# Patient Record
Sex: Female | Born: 2000 | Race: Black or African American | Hispanic: No | Marital: Single | Smoking: Never smoker
Health system: Southern US, Community
[De-identification: ages and names within clinical notes are randomized; demographics above are authoritative.]

## PROBLEM LIST (undated history)

## (undated) ENCOUNTER — Ambulatory Visit (HOSPITAL_COMMUNITY): Payer: MEDICAID | Source: Home / Self Care

## (undated) DIAGNOSIS — T7840XA Allergy, unspecified, initial encounter: Secondary | ICD-10-CM

## (undated) DIAGNOSIS — F419 Anxiety disorder, unspecified: Secondary | ICD-10-CM

## (undated) HISTORY — PX: APPENDECTOMY: SHX54

---
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
## 2022-02-25 ENCOUNTER — Ambulatory Visit (HOSPITAL_COMMUNITY)
Admission: EM | Admit: 2022-02-25 | Discharge: 2022-02-25 | Disposition: A | Discharge status: Home / Self Care | Payer: Medicaid Other | Attending: Family Medicine | Admitting: Family Medicine

## 2022-02-25 ENCOUNTER — Encounter (HOSPITAL_COMMUNITY): Payer: Self-pay | Admitting: Emergency Medicine

## 2022-02-25 DIAGNOSIS — R059 Cough, unspecified: Secondary | ICD-10-CM

## 2022-02-25 MED ORDER — BENZONATATE 100 MG PO CAPS: 100.0000 mg | ORAL_CAPSULE | Freq: Three times a day (TID) | ORAL | 0 refills | Status: DC

## 2022-02-25 NOTE — ED Provider Notes (Signed)
[facility]    CSN: [id number] Arrival date & time: 02/25/22  1635      History   Chief Complaint Chief Complaint  Patient presents with   Headache   Cough   Generalized Body Aches   Emesis    HPI Virginia Moses is a 21 y.o. female.   HPI Patient presents today for evaluation of a 3 day history of cough,  headache, body aches, nausea, and nasal congestion.  She reports all symptoms have resolved with exception of a mild cough.  Needs a return to work note.  She denies any worsening symptoms of shortness of breath, wheezing or chest pain.  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 Authorizing Provider  benzonatate (TESSALON) 100 MG capsule Take 1 capsule (100 mg total) by mouth every 8 (eight) hours. 02/25/22  Yes Bing Neighbors, FNP  cetirizine (ZYRTEC ALLERGY) 10 MG tablet Take 1 tablet (10 mg total) by mouth at bedtime. 12/16/21 03/16/22  Theadora Rama Scales, PA-C  fluticasone (FLONASE) 50 MCG/ACT nasal spray Place 2 sprays into both nostrils daily. 12/16/21   Theadora Rama Scales, PA-C  ibuprofen (ADVIL) 400 MG tablet Take 1 tablet (400 mg total) by mouth every 8 (eight) hours as needed for up to 30 doses. 12/16/21   Theadora Rama Scales, PA-C  ipratropium (ATROVENT) 0.06 % nasal spray Place 2  sprays into both nostrils 4 (four) times daily. As needed for nasal congestion, runny nose 12/16/21   Theadora Rama Scales, PA-C  Chlorpheniramine Maleate (ALLERGY PO) Take 1 tablet by mouth daily as needed (allergies).  06/15/19  [provider]    Family History Family History  Problem Relation Age of Onset   Dementia Mother    Heart disease Father    Hypertension Paternal Grandmother     Social History Social History   Tobacco Use   Smoking status: Never    Passive exposure: Yes   Smokeless tobacco: Never  Vaping Use   Vaping Use: Never used  Substance Use Topics   Alcohol use: No   Drug use: Not Currently    Types: Marijuana     Allergies   Patient has no known allergies.   Review of Systems Review of Systems Pertinent negatives listed in HPI  Physical Exam Triage Vital Signs ED Triage Vitals [02/25/22 1649]  Enc Vitals Group     BP 125/83     Pulse Rate 74     Resp 18  Temp 98.5 F (36.9 C)     Temp src      SpO2 98 %     Weight      Height      Head Circumference      Peak Flow      Pain Score 0     Pain Loc      Pain Edu?      Excl. in GC?    No data found.  Updated Vital Signs BP 125/83   Pulse 74   Temp 98.5 F (36.9 C)   Resp 18   SpO2 98%   Visual Acuity Right Eye Distance:   Left Eye Distance:   Bilateral Distance:    Right Eye Near:   Left Eye Near:    Bilateral Near:     Physical Exam HENT:     Head: Normocephalic and atraumatic.  Eyes:     Extraocular Movements: Extraocular movements intact.     Pupils: Pupils are equal, round, and reactive to light.  Cardiovascular:     Rate and Rhythm: Normal rate and regular rhythm.  Pulmonary:     Effort: Pulmonary effort is normal.     Breath sounds: Normal breath sounds.  Musculoskeletal:     Cervical back: Normal range of motion.  Skin:    General: Skin is warm.  Neurological:     Mental Status: She is alert.     GCS: GCS eye subscore is 4. GCS verbal subscore  is 5. GCS motor subscore is 6.  Psychiatric:        Mood and Affect: Mood normal.     UC Treatments / Results  Labs (all labs ordered are listed, but only abnormal results are displayed) Labs Reviewed - No data to display  EKG   Radiology No results found.  Procedures Procedures (including critical care time)  Medications Ordered in UC Medications - No data to display  Initial Impression / Assessment and Plan / UC Course  I have reviewed the triage vital signs and the nursing notes.  Pertinent labs & imaging results that were available during my care of the patient were reviewed by me and considered in my medical decision making (see chart for details).    Cough, s/p recent viral illness Work note provided. Benzonatate TID PRN Return as needed Final Clinical Impressions(s) / UC Diagnoses   Final diagnoses:  Cough, unspecified type   Discharge Instructions   None    ED Prescriptions     Medication Sig Dispense Auth. Provider   benzonatate (TESSALON) 100 MG capsule Take 1 capsule (100 mg total) by mouth every 8 (eight) hours. 21 capsule Bing Neighbors, FNP      PDMP not reviewed this encounter.   Bing Neighbors, FNP 02/25/22 1725

## 2022-02-25 NOTE — ED Triage Notes (Signed)
Pt is present today with c/o vomiting, cough, HA, and body aches. Pt sx started Friday

## 2022-08-27 ENCOUNTER — Ambulatory Visit (HOSPITAL_COMMUNITY)
Admission: EM | Admit: 2022-08-27 | Discharge: 2022-08-27 | Disposition: A | Discharge status: Home / Self Care | Payer: Medicaid Other

## 2022-08-27 ENCOUNTER — Encounter (HOSPITAL_COMMUNITY): Payer: Self-pay

## 2022-08-27 DIAGNOSIS — R112 Nausea with vomiting, unspecified: Secondary | ICD-10-CM | POA: Diagnosis not present

## 2022-08-27 NOTE — ED Triage Notes (Signed)
Pt needs a work note Saturday - Monday

## 2022-08-27 NOTE — ED Provider Notes (Signed)
[facility]    CSN: [id number] Arrival date & time: 08/27/22  1711      History   Chief Complaint No chief complaint on file.   HPI Virginia Moses is a 21 y.o. female.   Pt reports about three days ago she started experiencing nausea, vomiting, and fatigue.  She missed work due to [employer].  She denies fever, chill, diarrhea, cough, congestion. Pt would like to return to work tonight and is requesting a work note to return.     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 Authorizing Provider  benzonatate (TESSALON) 100 MG capsule Take 1 capsule (100 mg total) by mouth every 8 (eight) hours. 02/25/22   Bing Neighbors, FNP  cetirizine (ZYRTEC ALLERGY) 10 MG tablet Take 1 tablet (10 mg total) by mouth at bedtime. 12/16/21 03/16/22  Theadora Rama Scales, PA-C  fluticasone (FLONASE) 50 MCG/ACT nasal spray Place 2 sprays into both nostrils daily. 12/16/21   Theadora Rama Scales, PA-C  ibuprofen (ADVIL) 400 MG tablet Take 1 tablet (400 mg total) by mouth every 8 (eight) hours as needed for up to 30 doses. 12/16/21   Theadora Rama Scales, PA-C  ipratropium (ATROVENT) 0.06 % nasal spray Place 2 sprays into both nostrils 4 (four) times daily. As needed for nasal congestion, runny nose 12/16/21   Theadora Rama Scales,  PA-C  Chlorpheniramine Maleate (ALLERGY PO) Take 1 tablet by mouth daily as needed (allergies).  06/15/19  [provider]    Family History Family History  Problem Relation Age of Onset   Dementia Mother    Heart disease Father    Hypertension Paternal Grandmother     Social History Social History   Tobacco Use   Smoking status: Never    Passive exposure: Yes   Smokeless tobacco: Never  Vaping Use   Vaping Use: Never used  Substance Use Topics   Alcohol use: No   Drug use: Not Currently    Types: Marijuana     Allergies   Patient has no known allergies.   Review of Systems Review of Systems  Constitutional:  Negative for chills and fever.  HENT:  Negative for ear pain and sore throat.   Eyes:  Negative for pain and visual disturbance.  Respiratory:  Negative for cough and shortness of breath.   Cardiovascular:  Negative for chest pain and palpitations.  Gastrointestinal:  Negative for abdominal pain and vomiting.  Genitourinary:  Negative for dysuria and hematuria.  Musculoskeletal:  Negative for arthralgias and back pain.  Skin:  Negative for color change and rash.  Neurological:  Negative for seizures and syncope.  All other systems reviewed and are negative.    Physical Exam Triage Vital Signs ED Triage Vitals [08/27/22 1811]  Enc Vitals Group     BP 108/69     Pulse Rate 71     Resp 20     Temp 98.2 F (36.8 C)     Temp Source Oral     SpO2 99 %     Weight      Height      Head Circumference      Peak Flow      Pain Score      Pain Loc      Pain Edu?      Excl. in GC?    No data found.  Updated Vital Signs BP 108/69 (BP Location: Left Arm)   Pulse 71   Temp 98.2 F (36.8 C) (Oral)   Resp 20   LMP 08/13/2022   SpO2 99%   Visual Acuity Right Eye Distance:   Left Eye Distance:   Bilateral Distance:    Right Eye Near:   Left Eye Near:    Bilateral Near:     Physical Exam Vitals and nursing note reviewed.   Constitutional:      General: She is not in acute distress.    Appearance: She is well-developed.  HENT:     Head: Normocephalic and atraumatic.  Eyes:     Conjunctiva/sclera: Conjunctivae normal.  Cardiovascular:     Rate and Rhythm: Normal rate and regular rhythm.     Heart sounds: No murmur heard. Pulmonary:     Effort: Pulmonary effort is normal. No respiratory distress.     Breath sounds: Normal breath sounds.  Abdominal:     Palpations: Abdomen is soft.     Tenderness: There is no abdominal tenderness.  Musculoskeletal:        General: No swelling.     Cervical back: Neck supple.  Skin:    General: Skin is warm and dry.     Capillary Refill: Capillary refill takes less than 2 seconds.  Neurological:     Mental Status: She is alert.  Psychiatric:        Mood and Affect: Mood normal.      UC Treatments / Results  Labs (all labs ordered are listed, but only abnormal results are displayed) Labs Reviewed - No data to display  EKG   Radiology No results found.  Procedures Procedures (including critical care time)  Medications Ordered in UC Medications - No data to display  Initial Impression / Assessment and Plan / UC Course  I have reviewed the triage vital signs and the nursing notes.  Pertinent labs & imaging results that were available during my care of the patient were reviewed by me and considered in my medical decision making (see chart for details).     Nausea and vomiting.  Sx have resolved at this time.  Work note given for patient to return tonight.  Return precautions given.  Final Clinical Impressions(s) / UC Diagnoses   Final diagnoses:  Nausea and vomiting, unspecified vomiting type   Discharge Instructions   None    ED Prescriptions   None    PDMP not reviewed this encounter.   Ward, Tylene Fantasia, PA-C 08/27/22 2020

## 2022-12-17 ENCOUNTER — Ambulatory Visit (HOSPITAL_COMMUNITY)
Admission: EM | Admit: 2022-12-17 | Discharge: 2022-12-17 | Disposition: A | Discharge status: Home / Self Care | Payer: Medicaid Other | Attending: Family Medicine | Admitting: Family Medicine

## 2022-12-17 ENCOUNTER — Encounter (HOSPITAL_COMMUNITY): Payer: Self-pay

## 2022-12-17 DIAGNOSIS — K529 Noninfective gastroenteritis and colitis, unspecified: Secondary | ICD-10-CM

## 2022-12-17 MED ORDER — ONDANSETRON 4 MG PO TBDP: 4.0000 mg | ORAL_TABLET | Freq: Three times a day (TID) | ORAL | 0 refills | Status: DC | PRN

## 2022-12-17 NOTE — ED Triage Notes (Signed)
Pt states N/V/D since last night.

## 2022-12-17 NOTE — ED Provider Notes (Signed)
[facility]    CSN: [id number] Arrival date & time: 12/17/22  1305      History   Chief Complaint Chief Complaint  Patient presents with   Emesis    HPI Virginia Moses is a 22 y.o. female.    Emesis  Here for nausea and vomiting and diarrhea.  Symptoms began about 130 this morning.  She has thrown up about 5 or 6 times.  She has had diarrhea stool about twice.  She cannot tell if she has had any blood in anything.  No cough or congestion. Last menstrual cycle was March 1.   Past Medical History:  Diagnosis Date   Allergy    Anxiety     Patient Active Problem List   Diagnosis Date Noted   MDD (major depressive disorder), recurrent severe, without psychosis ([place]) 08/28/2020   Suicide attempt by drug ingestion ([place]) 08/27/2020   Intentional ibuprofen overdose ([place])    Suicidal behavior with attempted self-injury [facility])     Past Surgical History:  Procedure Laterality Date   APPENDECTOMY      OB History     Gravida  0   Para      Term      Preterm      AB      Living         SAB      IAB      Ectopic      Multiple      Live Births               Home Medications    Prior to Admission medications   Medication Sig Start Date End Date Taking? Authorizing Provider  ondansetron (ZOFRAN-ODT) 4 MG disintegrating tablet Take 1 tablet (4 mg total) by mouth every 8 (eight) hours as needed for nausea or vomiting. 12/17/22  Yes Barrett Henle, MD  cetirizine (ZYRTEC ALLERGY) 10 MG tablet Take 1 tablet (10 mg total) by mouth at bedtime. 12/16/21 03/16/22  Lynden Oxford Scales, PA-C  Chlorpheniramine Maleate (ALLERGY PO) Take 1 tablet by mouth daily as needed (allergies).  06/15/19  [provider]    Family History Family History  Problem Relation Age of Onset   Dementia Mother    Heart disease Father    Hypertension Paternal Grandmother     Social History Social History   Tobacco Use   Smoking status: Never     Passive exposure: Yes   Smokeless tobacco: Never  Vaping Use   Vaping Use: Never used  Substance Use Topics   Alcohol use: No   Drug use: Not Currently    Types: Marijuana     Allergies   Patient has no known allergies.   Review of Systems Review of Systems  Gastrointestinal:  Positive for vomiting.     Physical Exam Triage Vital Signs ED Triage Vitals  Enc Vitals Group     BP 12/17/22 1331 115/82     Pulse Rate 12/17/22 1331 (!) 101     Resp 12/17/22 1331 16     Temp 12/17/22 1331 98.8 F (37.1 C)     Temp Source 12/17/22 1331 Oral     SpO2 12/17/22 1331 98 %     Weight --      Height --      Head Circumference --      Peak Flow --      Pain Score 12/17/22 1333 0     Pain Loc --  Pain Edu? --      Excl. in [place]? --    No data found.  Updated Vital Signs BP 115/82 (BP Location: Right Arm)   Pulse (!) 101   Temp 98.8 F (37.1 C) (Oral)   Resp 16   LMP 11/29/2022 (Approximate)   SpO2 98%   Visual Acuity Right Eye Distance:   Left Eye Distance:   Bilateral Distance:    Right Eye Near:   Left Eye Near:    Bilateral Near:     Physical Exam Vitals reviewed.  Constitutional:      General: She is not in acute distress.    Appearance: She is not ill-appearing, toxic-appearing or diaphoretic.  HENT:     Nose: Nose normal.     Mouth/Throat:     Mouth: Mucous membranes are moist.     Pharynx: No oropharyngeal exudate or posterior oropharyngeal erythema.  Eyes:     Extraocular Movements: Extraocular movements intact.     Conjunctiva/sclera: Conjunctivae normal.     Pupils: Pupils are equal, round, and reactive to light.  Cardiovascular:     Rate and Rhythm: Normal rate and regular rhythm.     Heart sounds: No murmur heard. Pulmonary:     Effort: No respiratory distress.     Breath sounds: No stridor. No wheezing, rhonchi or rales.  Abdominal:     General: There is no distension.     Palpations: Abdomen is soft.     Tenderness: There is  abdominal tenderness (generalized).  Musculoskeletal:     Cervical back: Neck supple.  Lymphadenopathy:     Cervical: No cervical adenopathy.  Skin:    Capillary Refill: Capillary refill takes less than 2 seconds.     Coloration: Skin is not jaundiced or pale.  Neurological:     General: No focal deficit present.     Mental Status: She is alert and oriented to person, place, and time.  Psychiatric:        Behavior: Behavior normal.      UC Treatments / Results  Labs (all labs ordered are listed, but only abnormal results are displayed) Labs Reviewed - No data to display  EKG   Radiology No results found.  Procedures Procedures (including critical care time)  Medications Ordered in UC Medications - No data to display  Initial Impression / Assessment and Plan / UC Course  I have reviewed the triage vital signs and the nursing notes.  Pertinent labs & imaging results that were available during my care of the patient were reviewed by me and considered in my medical decision making (see chart for details).         Final Clinical Impressions(s) / UC Diagnoses   Final diagnoses:  Gastroenteritis     Discharge Instructions      Ondansetron dissolved in the mouth every 8 hours as needed for nausea or vomiting. Clear liquids and bland things to eat. Avoid acidic foods like lemon/lime/orange/tomato.       ED Prescriptions     Medication Sig Dispense Auth. Provider   ondansetron (ZOFRAN-ODT) 4 MG disintegrating tablet Take 1 tablet (4 mg total) by mouth every 8 (eight) hours as needed for nausea or vomiting. 10 tablet Windy Carina Gwenlyn Perking, MD      PDMP not reviewed this encounter.   Barrett Henle, MD 12/17/22 1400

## 2022-12-17 NOTE — Discharge Instructions (Signed)
Ondansetron dissolved in the mouth every 8 hours as needed for nausea or vomiting. Clear liquids and bland things to eat. Avoid acidic foods like lemon/lime/orange/tomato.  

## 2023-01-08 ENCOUNTER — Ambulatory Visit
Admission: EM | Admit: 2023-01-08 | Discharge: 2023-01-08 | Disposition: A | Discharge status: Home / Self Care | Payer: Medicaid Other | Attending: Internal Medicine | Admitting: Internal Medicine

## 2023-01-08 DIAGNOSIS — Z113 Encounter for screening for infections with a predominantly sexual mode of transmission: Secondary | ICD-10-CM | POA: Diagnosis not present

## 2023-01-08 DIAGNOSIS — J069 Acute upper respiratory infection, unspecified: Secondary | ICD-10-CM

## 2023-01-08 DIAGNOSIS — N898 Other specified noninflammatory disorders of vagina: Secondary | ICD-10-CM

## 2023-01-08 DIAGNOSIS — Z3202 Encounter for pregnancy test, result negative: Secondary | ICD-10-CM | POA: Diagnosis not present

## 2023-01-08 DIAGNOSIS — N926 Irregular menstruation, unspecified: Secondary | ICD-10-CM | POA: Diagnosis not present

## 2023-01-08 LAB — POCT URINE PREGNANCY: Preg Test, Ur: NEGATIVE

## 2023-01-08 NOTE — Discharge Instructions (Signed)
STD testing is pending.  We will call if it is abnormal.  Urine pregnancy test was negative.  Blood work for pregnancy is pending.  Please follow-up if any symptoms persist or worsen.  Refrain from sexual activity until test results and treatment are complete if treatment is necessary.  If you continue to miss your menstrual cycle, please follow-up with gynecology.

## 2023-01-08 NOTE — ED Triage Notes (Signed)
Pt here for work note to return tomorrow. States has been sick since last Friday. Now feels everything has resolved except some nasal drainage. Ready to return to work.

## 2023-01-08 NOTE — ED Provider Notes (Signed)
[facility]    CSN: [id number] Arrival date & time: 01/08/23  1229      History   Chief Complaint Chief Complaint  Patient presents with   sti screening    HPI Virginia Moses is a 22 y.o. female.   Patient presents with 2 different complaints today.  Patient reports that she needs a note to return to work after having upper respiratory infection.  She reports that she had nasal congestion, nasal drainage, sore throat, cough that started 6 days ago.  She reports all symptoms have resolved except for runny nose.  She denies any fevers.  Has taken over the counter cold and flu medications with resolution of symptoms.  Denies history of asthma.  Patient also presenting for STD testing and pregnancy test.  Patient reports that she has had a vaginal odor for about a week but no vaginal discharge or associated symptoms.  She has had unprotected intercourse with a female sexual partner but denies exposure to STD.  Last menstrual cycle was 11/29/2022.  She does not use a form of birth control.      Past Medical History:  Diagnosis Date   Allergy    Anxiety     Patient Active Problem List   Diagnosis Date Noted   MDD (major depressive disorder), recurrent severe, without psychosis 08/28/2020   Suicide attempt by drug ingestion 08/27/2020   Intentional ibuprofen overdose    Suicidal behavior with attempted self-injury [facility](HCC)     Past Surgical History:  Procedure Laterality Date   APPENDECTOMY      OB History     Gravida  0   Para      Term      Preterm      AB      Living         SAB      IAB      Ectopic      Multiple      Live Births               Home Medications    Prior to Admission medications   Medication Sig Start Date End Date Taking? Authorizing Provider  cetirizine (ZYRTEC ALLERGY) 10 MG tablet Take 1 tablet (10 mg total) by mouth at bedtime. 12/16/21 03/16/22  Theadora RamaMorgan, Lindsay Scales, PA-C  ondansetron (ZOFRAN-ODT) 4 MG  disintegrating tablet Take 1 tablet (4 mg total) by mouth every 8 (eight) hours as needed for nausea or vomiting. 12/17/22   Zenia ResidesBanister, Pamela K, MD  Chlorpheniramine Maleate (ALLERGY PO) Take 1 tablet by mouth daily as needed (allergies).  06/15/19  [provider]    Family History Family History  Problem Relation Age of Onset   Dementia Mother    Heart disease Father    Hypertension Paternal Grandmother     Social History Social History   Tobacco Use   Smoking status: Never    Passive exposure: Yes   Smokeless tobacco: Never  Vaping Use   Vaping Use: Never used  Substance Use Topics   Alcohol use: No   Drug use: Not Currently    Types: Marijuana     Allergies   Patient has no known allergies.   Review of Systems Review of Systems Per HPI  Physical Exam Triage Vital Signs ED Triage Vitals [01/08/23 1429]  Enc Vitals Group     BP 118/74     Pulse Rate 61     Resp 16     Temp 98.9  F (37.2 C)     Temp Source Oral     SpO2 99 %     Weight      Height      Head Circumference      Peak Flow      Pain Score 0     Pain Loc      Pain Edu?      Excl. in [place]?    No data found.  Updated Vital Signs BP 118/74 (BP Location: Left Arm)   Pulse 61   Temp 98.9 F (37.2 C) (Oral)   Resp 16   LMP 11/29/2022 (Approximate)   SpO2 99%   Visual Acuity Right Eye Distance:   Left Eye Distance:   Bilateral Distance:    Right Eye Near:   Left Eye Near:    Bilateral Near:     Physical Exam Constitutional:      General: She is not in acute distress.    Appearance: Normal appearance. She is not toxic-appearing or diaphoretic.  HENT:     Head: Normocephalic and atraumatic.     Right Ear: Tympanic membrane and ear canal normal.     Left Ear: Tympanic membrane and ear canal normal.     Nose: Rhinorrhea present. No congestion. Rhinorrhea is clear.     Mouth/Throat:     Mouth: Mucous membranes are moist.     Pharynx: No posterior oropharyngeal erythema.   Eyes:     Extraocular Movements: Extraocular movements intact.     Conjunctiva/sclera: Conjunctivae normal.     Pupils: Pupils are equal, round, and reactive to light.  Cardiovascular:     Rate and Rhythm: Normal rate and regular rhythm.     Pulses: Normal pulses.     Heart sounds: Normal heart sounds.  Pulmonary:     Effort: Pulmonary effort is normal. No respiratory distress.     Breath sounds: Normal breath sounds. No wheezing.  Abdominal:     General: Abdomen is flat. Bowel sounds are normal.     Palpations: Abdomen is soft.  Genitourinary:    Comments: Deferred with shared decision making.  Self swab performed. Musculoskeletal:        General: Normal range of motion.     Cervical back: Normal range of motion.  Skin:    General: Skin is warm and dry.  Neurological:     General: No focal deficit present.     Mental Status: She is alert and oriented to person, place, and time. Mental status is at baseline.  Psychiatric:        Mood and Affect: Mood normal.        Behavior: Behavior normal.      UC Treatments / Results  Labs (all labs ordered are listed, but only abnormal results are displayed) Labs Reviewed  RPR  HIV ANTIBODY (ROUTINE TESTING W REFLEX)  HCG, QUANTITATIVE, PREGNANCY  POCT URINE PREGNANCY  CERVICOVAGINAL ANCILLARY ONLY    EKG   Radiology No results found.  Procedures Procedures (including critical care time)  Medications Ordered in UC Medications - No data to display  Initial Impression / Assessment and Plan / UC Course  I have reviewed the triage vital signs and the nursing notes.  Pertinent labs & imaging results that were available during my care of the patient were reviewed by me and considered in my medical decision making (see chart for details).     1.  Viral upper respiratory infection  Patient reports symptoms have mainly resolved  except runny nose which is notable on exam.  She is here for work note.  Advised patient that she  may still be contagious but it is reassuring that she has been fever free without medication.  Therefore, work note was provided for patient.  Advised supportive care and symptom management.  Advised strict return precautions if symptoms persist or worsen.  Patient verbalized understanding and was agreeable with plan.  2.  Vaginal odor  Vaginal swab is pending.  Awaiting result.  Given no confirmed exposure to STD, will await result for treatment.  HIV and RPR pending.  Urine pregnancy test was negative.  Will obtain quantitative hCG given patient has missed menstrual cycle.  Encouraged her to follow-up with provided contact information for gynecology if menstrual cycle continues to not start.  Patient verbalized understanding and was agreeable with plan. Final Clinical Impressions(s) / UC Diagnoses   Final diagnoses:  Viral upper respiratory infection  Screening examination for venereal disease  Vaginal odor  Missed menses  Urine pregnancy test negative     Discharge Instructions      STD testing is pending.  We will call if it is abnormal.  Urine pregnancy test was negative.  Blood work for pregnancy is pending.  Please follow-up if any symptoms persist or worsen.  Refrain from sexual activity until test results and treatment are complete if treatment is necessary.  If you continue to miss your menstrual cycle, please follow-up with gynecology.     ED Prescriptions   None    PDMP not reviewed this encounter.   Gustavus Bryant, [place] 01/08/23 [phone]

## 2023-01-08 NOTE — ED Triage Notes (Signed)
Also here for sti screening and pregnancy test and blood work

## 2023-01-09 LAB — CERVICOVAGINAL ANCILLARY ONLY
Bacterial Vaginitis (gardnerella): NEGATIVE
Candida Glabrata: NEGATIVE
Candida Vaginitis: NEGATIVE
Chlamydia: NEGATIVE
Comment: NEGATIVE
Comment: NEGATIVE
Comment: NEGATIVE
Comment: NEGATIVE
Comment: NEGATIVE
Comment: NORMAL
Neisseria Gonorrhea: NEGATIVE
Trichomonas: NEGATIVE

## 2023-01-09 LAB — RPR: RPR Ser Ql: NONREACTIVE

## 2023-01-09 LAB — HIV ANTIBODY (ROUTINE TESTING W REFLEX): HIV Screen 4th Generation wRfx: NONREACTIVE

## 2023-10-01 ENCOUNTER — Ambulatory Visit (HOSPITAL_COMMUNITY)
Admission: EM | Admit: 2023-10-01 | Discharge: 2023-10-01 | Disposition: A | Discharge status: Home / Self Care | Payer: MEDICAID | Attending: Physician Assistant | Admitting: Physician Assistant

## 2023-10-01 ENCOUNTER — Encounter (HOSPITAL_COMMUNITY): Payer: Self-pay

## 2023-10-01 DIAGNOSIS — J069 Acute upper respiratory infection, unspecified: Secondary | ICD-10-CM

## 2023-10-01 LAB — POC COVID19/FLU A&B COMBO
Covid Antigen, POC: NEGATIVE
Influenza A Antigen, POC: NEGATIVE
Influenza B Antigen, POC: NEGATIVE

## 2023-10-01 MED ORDER — BENZONATATE 100 MG PO CAPS: 100.0000 mg | ORAL_CAPSULE | Freq: Three times a day (TID) | ORAL | 0 refills | Status: AC

## 2023-10-01 MED ORDER — CETIRIZINE HCL 10 MG PO TABS: 10.0000 mg | ORAL_TABLET | Freq: Every day | ORAL | 0 refills | Status: AC

## 2023-10-01 NOTE — ED Triage Notes (Signed)
 Sore throat, nasal congestion, headache x yesterday.  States her sister was sister was sick as well but not tested for anything.   Patient tried Alka-seltzer cold with no relief.

## 2023-10-01 NOTE — ED Provider Notes (Signed)
 [facility]    CSN: [id number] Arrival date & time: 10/01/23  1202      History   Chief Complaint Chief Complaint  Patient presents with   Sore Throat   Nasal Congestion    HPI Virginia Moses is a 23 y.o. female.   Patient presents today with a 24 to 36-hour history of URI symptoms.  She reports nasal congestion, headache, sore throat, cough.  Denies any fever, chest pain, shortness of breath, nausea, vomiting, diarrhea.  Reports her sister is also sick but does not know what she was diagnosed with.  She has tried some over-the-counter cold and flu medication without improvement of symptoms.  She denies any significant past medical history including asthma, COPD, smoking.  She does have seasonal allergies but has not required medication for this recently.  Denies any recent antibiotics or steroids.  She has had COVID several years ago.  She has not had COVID or influenza vaccinations.  She has no concern for pregnancy.    Past Medical History:  Diagnosis Date   Allergy    Anxiety     Patient Active Problem List   Diagnosis Date Noted   MDD (major depressive disorder), recurrent severe, without psychosis ([facility]) 08/28/2020   Suicide attempt by drug ingestion ([facility]) 08/27/2020   Intentional ibuprofen  overdose ([facility])    Suicidal behavior with attempted self-injury [facility])     Past Surgical History:  Procedure Laterality Date   APPENDECTOMY      OB History     Gravida  0   Para      Term      Preterm      AB      Living         SAB      IAB      Ectopic      Multiple      Live Births               Home Medications    Prior to Admission medications   Medication Sig Start Date End Date Taking? Authorizing Provider  benzonatate  (TESSALON ) 100 MG capsule Take 1 capsule (100 mg total) by mouth every 8 (eight) hours. 10/01/23  Yes Ricci Dirocco K, PA-C  cetirizine  (ZYRTEC  ALLERGY) 10 MG tablet Take 1 tablet (10 mg total) by mouth at bedtime.  10/01/23 12/30/23  Laquinn Shippy K, PA-C  Chlorpheniramine Maleate (ALLERGY PO) Take 1 tablet by mouth daily as needed (allergies).  06/15/19  [provider]    Family History Family History  Problem Relation Age of Onset   Dementia Mother    Heart disease Father    Hypertension Paternal Grandmother     Social History Social History   Tobacco Use   Smoking status: Never    Passive exposure: Yes   Smokeless tobacco: Never  Vaping Use   Vaping status: Never Used  Substance Use Topics   Alcohol use: No   Drug use: Not Currently    Types: Marijuana     Allergies   Patient has no known allergies.   Review of Systems Review of Systems  Constitutional:  Positive for activity change. Negative for appetite change, fatigue and fever.  HENT:  Positive for congestion, postnasal drip and sore throat. Negative for sinus pressure and sneezing.   Respiratory:  Positive for cough. Negative for shortness of breath.   Cardiovascular:  Negative for chest pain.  Gastrointestinal:  Negative for abdominal pain, diarrhea, nausea and vomiting.  Neurological:  Positive for headaches. Negative for dizziness and light-headedness.     Physical Exam Triage Vital Signs ED Triage Vitals  Encounter Vitals Group     BP 10/01/23 1245 99/65     Systolic BP Percentile --      Diastolic BP Percentile --      Pulse Rate 10/01/23 1245 88     Resp 10/01/23 1245 16     Temp 10/01/23 1245 98.1 F (36.7 C)     Temp Source 10/01/23 1245 Oral     SpO2 10/01/23 1245 98 %     Weight 10/01/23 1245 105 lb (47.6 kg)     Height 10/01/23 1245 5' 3 (1.6 m)     Head Circumference --      Peak Flow --      Pain Score 10/01/23 1244 0     Pain Loc --      Pain Education --      Exclude from Growth Chart --    No data found.  Updated Vital Signs BP 99/65 (BP Location: Right Arm)   Pulse 88   Temp 98.1 F (36.7 C) (Oral)   Resp 16   Ht 5' 3 (1.6 m)   Wt 105 lb (47.6 kg)   LMP 09/24/2023  (Approximate)   SpO2 98%   BMI 18.60 kg/m   Visual Acuity Right Eye Distance:   Left Eye Distance:   Bilateral Distance:    Right Eye Near:   Left Eye Near:    Bilateral Near:     Physical Exam Vitals reviewed.  Constitutional:      General: She is awake. She is not in acute distress.    Appearance: Normal appearance. She is well-developed. She is not ill-appearing.     Comments: Very pleasant female appears stated age in no acute distress sitting comfortably in exam room  HENT:     Head: Normocephalic and atraumatic.     Right Ear: Tympanic membrane, ear canal and external ear normal. Tympanic membrane is not erythematous or bulging.     Left Ear: Tympanic membrane, ear canal and external ear normal. Tympanic membrane is not erythematous or bulging.     Nose:     Right Sinus: No maxillary sinus tenderness or frontal sinus tenderness.     Left Sinus: No maxillary sinus tenderness or frontal sinus tenderness.     Mouth/Throat:     Pharynx: Uvula midline. No oropharyngeal exudate or posterior oropharyngeal erythema.  Cardiovascular:     Rate and Rhythm: Normal rate and regular rhythm.     Heart sounds: Normal heart sounds, S1 normal and S2 normal. No murmur heard. Pulmonary:     Effort: Pulmonary effort is normal.     Breath sounds: Normal breath sounds. No wheezing, rhonchi or rales.     Comments: Clear to auscultation bilaterally Psychiatric:        Behavior: Behavior is cooperative.      UC Treatments / Results  Labs (all labs ordered are listed, but only abnormal results are displayed) Labs Reviewed  POC COVID19/FLU A&B COMBO    EKG   Radiology No results found.  Procedures Procedures (including critical care time)  Medications Ordered in UC Medications - No data to display  Initial Impression / Assessment and Plan / UC Course  I have reviewed the triage vital signs and the nursing notes.  Pertinent labs & imaging results that were available during my  care of the patient were reviewed by me and considered  in my medical decision making (see chart for details).     Patient is well-appearing, afebrile, nontoxic, nontachycardic.  No evidence of acute infection on physical exam that would warrant initiation of antibiotics.  Viral testing was obtained and was negative in clinic.  Will treat symptomatically and she was started on Tessalon  for cough as well as cetirizine  to help manage her congestion.  Recommend she can use over-the-counter symptoms for additional symptom relief.  She is to rest and drink plenty of fluid.  If her symptoms are not improving within a week she is to return for reevaluation.  If she has any worsening symptoms she needs to be seen immediately.  Strict return precautions given.  Excuse note provided.  Final Clinical Impressions(s) / UC Diagnoses   Final diagnoses:  Upper respiratory tract infection, unspecified type     Discharge Instructions      You were negative for COVID and flu.  I believe you have a different virus.  Take cetirizine  10 mg at night.  Use Tessalon  for cough.  You can use over-the-counter medication including Mucinex , Tylenol , nasal saline/sinus rinses.  Make sure you rest and drink plenty of fluid.  If your symptoms are not improving within a week please return for reevaluation.  If anything worsens and you have high fever, worsening cough, shortness of breath, nausea, vomiting you need to be seen immediately.     ED Prescriptions     Medication Sig Dispense Auth. Provider   cetirizine  (ZYRTEC  ALLERGY) 10 MG tablet Take 1 tablet (10 mg total) by mouth at bedtime. 30 tablet Macklyn Glandon K, PA-C   benzonatate  (TESSALON ) 100 MG capsule Take 1 capsule (100 mg total) by mouth every 8 (eight) hours. 21 capsule Valita Righter K, PA-C      PDMP not reviewed this encounter.   Sherrell Rocky POUR, PA-C 10/01/23 1332

## 2023-10-01 NOTE — Discharge Instructions (Signed)
 You were negative for COVID and flu.  I believe you have a different virus.  Take cetirizine  10 mg at night.  Use Tessalon  for cough.  You can use over-the-counter medication including Mucinex , Tylenol , nasal saline/sinus rinses.  Make sure you rest and drink plenty of fluid.  If your symptoms are not improving within a week please return for reevaluation.  If anything worsens and you have high fever, worsening cough, shortness of breath, nausea, vomiting you need to be seen immediately.

## 2023-10-13 ENCOUNTER — Ambulatory Visit (HOSPITAL_COMMUNITY): Payer: MEDICAID

## 2023-11-27 ENCOUNTER — Ambulatory Visit (HOSPITAL_COMMUNITY): Payer: MEDICAID

## 2024-03-14 IMAGING — DX DG CHEST 2V
2 series · 2 of 2 positions shown · non-contrast
Comparison: Chest x-ray dated June 05, 2011.

CLINICAL DATA: Cough for the past 3 days.

EXAM:
CHEST - 2 VIEW

[chest pa]
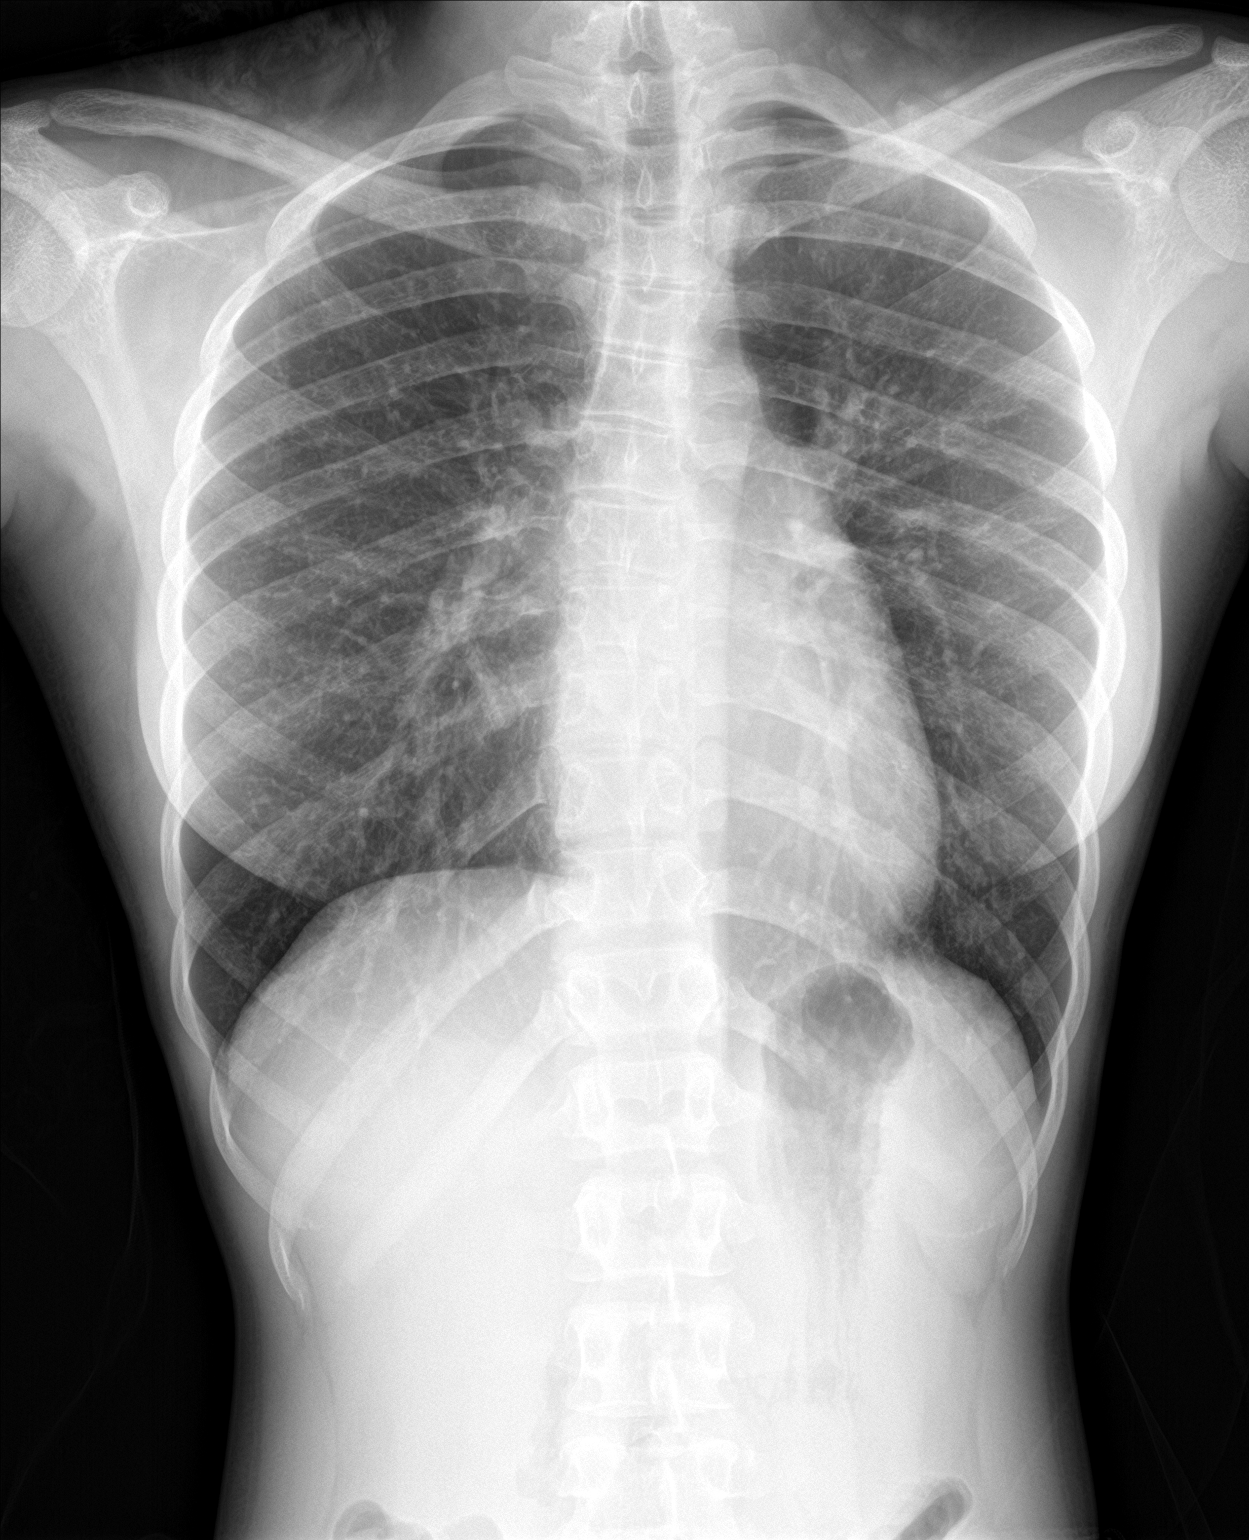

[chest lat]
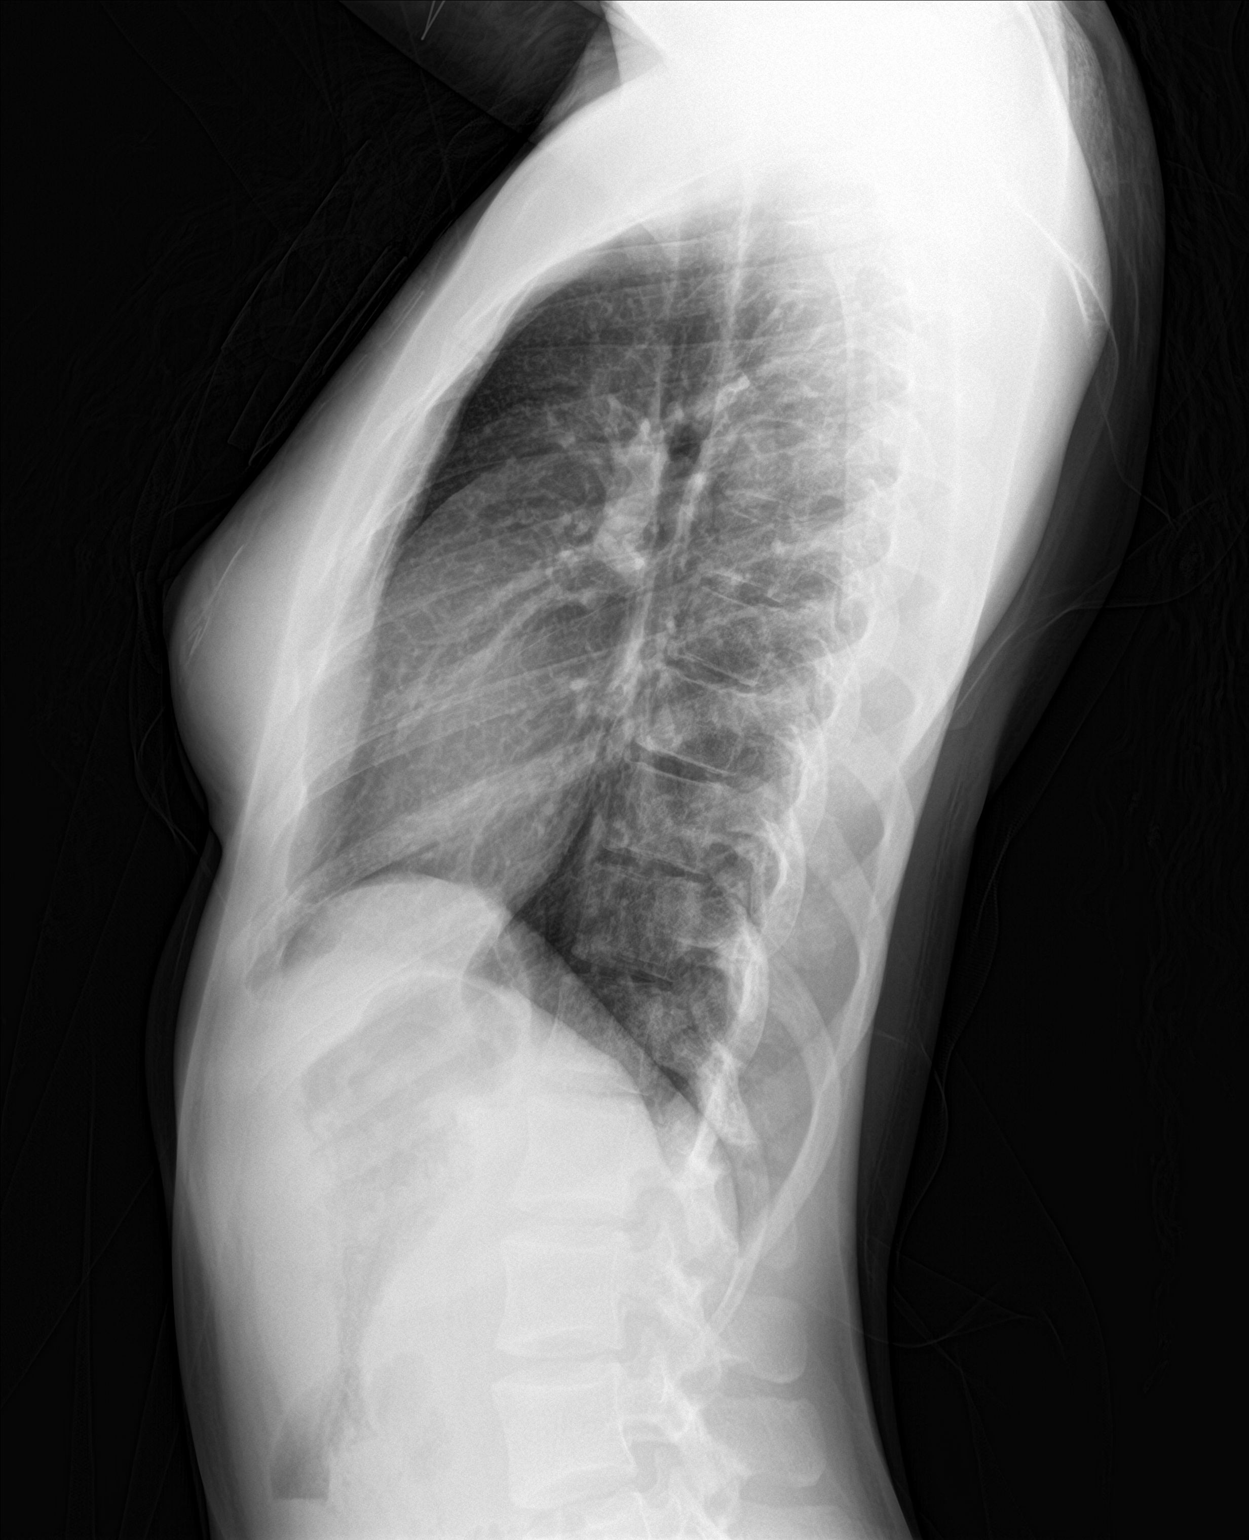

[2 of 2 positions shown; findings below may reference images not displayed]

FINDINGS: The heart size and mediastinal contours are within normal limits.
Both lungs are clear. The visualized skeletal structures are
unremarkable.
IMPRESSION: No active cardiopulmonary disease.

## 2024-04-03 ENCOUNTER — Ambulatory Visit (HOSPITAL_COMMUNITY): Payer: MEDICAID
